# Patient Record
Sex: Male | Born: 1975 | Hispanic: Yes | Marital: Married | Smoking: Never smoker
Health system: Southern US, Community
[De-identification: ages and names within clinical notes are randomized; demographics above are authoritative.]

## PROBLEM LIST (undated history)

## (undated) DIAGNOSIS — G43909 Migraine, unspecified, not intractable, without status migrainosus: Secondary | ICD-10-CM

## (undated) HISTORY — DX: Migraine, unspecified, not intractable, without status migrainosus: G43.909

## (undated) HISTORY — PX: APPENDECTOMY: SHX54

---

## 2007-03-14 ENCOUNTER — Emergency Department: Payer: Self-pay | Admitting: Emergency Medicine

## 2009-09-09 ENCOUNTER — Inpatient Hospital Stay: Payer: Self-pay | Admitting: Surgery

## 2017-02-12 ENCOUNTER — Encounter: Payer: Self-pay | Admitting: *Deleted

## 2017-02-12 DIAGNOSIS — R197 Diarrhea, unspecified: Secondary | ICD-10-CM | POA: Insufficient documentation

## 2017-02-12 DIAGNOSIS — R112 Nausea with vomiting, unspecified: Secondary | ICD-10-CM | POA: Insufficient documentation

## 2017-02-12 DIAGNOSIS — R51 Headache: Secondary | ICD-10-CM | POA: Insufficient documentation

## 2017-02-12 LAB — URINALYSIS, COMPLETE (UACMP) WITH MICROSCOPIC
BILIRUBIN URINE: NEGATIVE
Bacteria, UA: NONE SEEN
Glucose, UA: NEGATIVE mg/dL
Hgb urine dipstick: NEGATIVE
Ketones, ur: 20 mg/dL — AB
Leukocytes, UA: NEGATIVE
Nitrite: NEGATIVE
Protein, ur: 100 mg/dL — AB
SPECIFIC GRAVITY, URINE: 1.025 (ref 1.005–1.030)
SQUAMOUS EPITHELIAL / LPF: NONE SEEN
pH: 8 (ref 5.0–8.0)

## 2017-02-12 LAB — CBC
HCT: 44.9 % (ref 40.0–52.0)
Hemoglobin: 15.8 g/dL (ref 13.0–18.0)
MCH: 30.1 pg (ref 26.0–34.0)
MCHC: 35.2 g/dL (ref 32.0–36.0)
MCV: 85.6 fL (ref 80.0–100.0)
Platelets: 227 10*3/uL (ref 150–440)
RBC: 5.25 MIL/uL (ref 4.40–5.90)
RDW: 13.1 % (ref 11.5–14.5)
WBC: 11.7 10*3/uL — ABNORMAL HIGH (ref 3.8–10.6)

## 2017-02-12 LAB — COMPREHENSIVE METABOLIC PANEL
ALT: 93 U/L — AB (ref 17–63)
AST: 45 U/L — AB (ref 15–41)
Albumin: 4.8 g/dL (ref 3.5–5.0)
Alkaline Phosphatase: 87 U/L (ref 38–126)
Anion gap: 10 (ref 5–15)
BUN: 14 mg/dL (ref 6–20)
CHLORIDE: 102 mmol/L (ref 101–111)
CO2: 29 mmol/L (ref 22–32)
Calcium: 9.7 mg/dL (ref 8.9–10.3)
Creatinine, Ser: 0.81 mg/dL (ref 0.61–1.24)
GFR calc Af Amer: >60 mL/min (ref >60)
GFR calc non Af Amer: >60 mL/min (ref >60)
GLUCOSE: 110 mg/dL — AB (ref 65–99)
POTASSIUM: 4 mmol/L (ref 3.5–5.1)
SODIUM: 141 mmol/L (ref 135–145)
Total Bilirubin: 1.2 mg/dL (ref 0.3–1.2)
Total Protein: 7.9 g/dL (ref 6.5–8.1)

## 2017-02-12 LAB — LIPASE, BLOOD: LIPASE: 19 U/L (ref 11–51)

## 2017-02-12 NOTE — ED Triage Notes (Signed)
Pt has vomiting, abd pain and headache.  Sx began this am.  No diarrhea.  Pt was seen at [facility] today with similar sx.  Pt alert.  Speech clear.

## 2017-02-13 ENCOUNTER — Emergency Department
Admission: EM | Admit: 2017-02-13 | Discharge: 2017-02-13 | Disposition: A | Discharge status: Home / Self Care | Payer: Self-pay | Attending: Emergency Medicine | Admitting: Emergency Medicine

## 2017-02-13 ENCOUNTER — Emergency Department: Payer: Self-pay

## 2017-02-13 ENCOUNTER — Encounter: Payer: Self-pay | Admitting: Emergency Medicine

## 2017-02-13 DIAGNOSIS — R519 Headache, unspecified: Secondary | ICD-10-CM

## 2017-02-13 DIAGNOSIS — R112 Nausea with vomiting, unspecified: Secondary | ICD-10-CM

## 2017-02-13 DIAGNOSIS — R197 Diarrhea, unspecified: Secondary | ICD-10-CM

## 2017-02-13 DIAGNOSIS — R51 Headache: Secondary | ICD-10-CM

## 2017-02-13 MED ORDER — KETOROLAC TROMETHAMINE 30 MG/ML IJ SOLN
30.0000 mg | Freq: Once | INTRAMUSCULAR | Status: AC
  Administered 2017-02-13: 30 mg via INTRAVENOUS
  Filled 2017-02-13: qty 1

## 2017-02-13 MED ORDER — DIPHENHYDRAMINE HCL 50 MG/ML IJ SOLN
25.0000 mg | Freq: Once | INTRAMUSCULAR | Status: AC
  Administered 2017-02-13: 25 mg via INTRAVENOUS
  Filled 2017-02-13: qty 1

## 2017-02-13 MED ORDER — METOCLOPRAMIDE HCL 5 MG/ML IJ SOLN
10.0000 mg | Freq: Once | INTRAMUSCULAR | Status: AC
  Administered 2017-02-13: 10 mg via INTRAVENOUS
  Filled 2017-02-13: qty 2

## 2017-02-13 MED ORDER — METOCLOPRAMIDE HCL 10 MG PO TABS: 10.0000 mg | ORAL_TABLET | Freq: Three times a day (TID) | ORAL | 0 refills | Status: AC | PRN

## 2017-02-13 MED ORDER — BUTALBITAL-APAP-CAFFEINE 50-325-40 MG PO TABS: 1.0000 | ORAL_TABLET | Freq: Four times a day (QID) | ORAL | 0 refills | Status: AC | PRN

## 2017-02-13 MED ORDER — SODIUM CHLORIDE 0.9 % IV BOLUS (SEPSIS)
1000.0000 mL | Freq: Once | INTRAVENOUS | Status: AC
  Administered 2017-02-13: 1000 mL via INTRAVENOUS

## 2017-02-13 NOTE — ED Notes (Signed)
Pt returned from CT °

## 2017-02-13 NOTE — ED Provider Notes (Signed)
[facility] Emergency Department Provider Note   ____________________________________________   First MD Initiated Contact with Patient 02/13/17 0214     (approximate)  I have reviewed the triage vital signs and the nursing notes.   HISTORY  Chief Complaint Emesis; Headache; and Abdominal Pain    HPI Harry Jenkins is a 41 y.o. male who comes into the hospital today with some nausea vomiting and headache for the last 2-3 weeks. He reports that the symptoms have not been getting better. He's been taking some medication given to him by his physician but reports it has not been helping. The patient states that the vomiting started yesterday.He states that he vomited about 10 times. The headache has gotten worse and he also had some dizziness. The patient saw his doctor yesterday and was given some Tylenol and meclizine but he states again that the medications are not helping. He has not had any problems with migraines in the past. He states that the pain is all around his head. He had one episode of diarrhea yesterday and some mild mid abdominal pain. His emesis is clear in appearance as he is unable to keep anything down. He denies any blurred vision, shortness of breath or chest pain. The patient is here today for evaluation.   History reviewed. No pertinent past medical history.  There are no active problems to display for this patient.   Past Surgical History:  Procedure Laterality Date  . APPENDECTOMY      Prior to Admission medications   Medication Sig Start Date End Date Taking? Authorizing Provider  butalbital-acetaminophen-caffeine (FIORICET, ESGIC) 50-325-40 MG tablet Take 1-2 tablets by mouth every 6 (six) hours as needed for headache. 02/13/17 02/13/18  Rebecka Apley, MD  metoCLOPramide (REGLAN) 10 MG tablet Take 1 tablet (10 mg total) by mouth every 8 (eight) hours as needed. 02/13/17   Rebecka Apley, MD    Allergies Patient has no  known allergies.  No family history on file.  Social History Social History  Substance Use Topics  . Smoking status: Never Smoker  . Smokeless tobacco: Never Used  . Alcohol use No    Review of Systems  Constitutional: No fever/chills Eyes: No visual changes. ENT: No sore throat. Cardiovascular: Denies chest pain. Respiratory: Denies shortness of breath. Gastrointestinal:  abdominal pain.   nausea, vomiting.   diarrhea.  No constipation. Genitourinary: Negative for dysuria. Musculoskeletal: Negative for back pain. Skin: Negative for rash. Neurological: Headache, dizziness   ____________________________________________   PHYSICAL EXAM:  VITAL SIGNS: ED Triage Vitals  Enc Vitals Group     BP 02/12/17 2300 (!) 141/90     Pulse Rate 02/12/17 2300 67     Resp 02/12/17 2300 20     Temp 02/12/17 2300 98.3 F (36.8 C)     Temp Source 02/12/17 2300 Oral     SpO2 02/12/17 2300 97 %     Weight 02/12/17 2301 160 lb (72.6 kg)     Height 02/12/17 2301  (1.727 m)     Head Circumference --      Peak Flow --      Pain Score 02/12/17 2300 9     Pain Loc --      Pain Edu? --      Excl. in GC? --     Constitutional: Alert and oriented. Well appearing and in Moderate distress. Eyes: Conjunctivae are normal. PERRL. EOMI. Head: Atraumatic. Nose: No congestion/rhinnorhea. Mouth/Throat: Mucous membranes are moist.  Oropharynx non-erythematous. Cardiovascular: Normal rate, regular rhythm. Grossly normal heart sounds.  Good peripheral circulation. Respiratory: Normal respiratory effort.  No retractions. Lungs CTAB. Gastrointestinal: Soft with some mild diffuse tenderness to palpation. No distention. Positive bowel sounds Musculoskeletal: No lower extremity tenderness nor edema.  No joint effusions. Neurologic:  Normal speech and language. Cranial nerves II through XII are grossly intact with no focal motor or neuro deficits Skin:  Skin is warm, dry and intact.  Psychiatric:  Mood and affect are normal.   ____________________________________________   LABS (all labs ordered are listed, but only abnormal results are displayed)  Labs Reviewed  COMPREHENSIVE METABOLIC PANEL - Abnormal; Notable for the following:       Result Value   Glucose, Bld 110 (*)    AST 45 (*)    ALT 93 (*)    All other components within normal limits  CBC - Abnormal; Notable for the following:    WBC 11.7 (*)    All other components within normal limits  URINALYSIS, COMPLETE (UACMP) WITH MICROSCOPIC - Abnormal; Notable for the following:    Color, Urine YELLOW (*)    APPearance CLEAR (*)    Ketones, ur 20 (*)    Protein, ur 100 (*)    All other components within normal limits  LIPASE, BLOOD   ____________________________________________  EKG  none ____________________________________________  RADIOLOGY  Ct Head Wo Contrast  Result Date: 02/13/2017 CLINICAL DATA:  Vomiting and headache, onset this morning. EXAM: CT HEAD WITHOUT CONTRAST TECHNIQUE: Contiguous axial images were obtained from the base of the skull through the vertex without intravenous contrast. COMPARISON:  None. FINDINGS: Brain: There is no intracranial hemorrhage, mass or evidence of acute infarction. There is no extra-axial fluid collection. Gray matter and white matter appear normal. Cerebral volume is normal for age. Brainstem and posterior fossa are unremarkable. The CSF spaces appear normal. Vascular: No hyperdense vessel or unexpected calcification. Skull: Normal. Negative for fracture or focal lesion. Sinuses/Orbits: No acute finding. Other: None IMPRESSION: Normal brain Electronically Signed   By: Ellery Plunk M.D.   On: 02/13/2017 03:28    ____________________________________________   PROCEDURES  Procedure(s) performed: None  Procedures  Critical Care performed: No  ____________________________________________   INITIAL IMPRESSION / ASSESSMENT AND PLAN / ED COURSE  As part of my  medical decision making, I reviewed the following data within the electronic MEDICAL RECORD NUMBER Notes from prior ED visits   This is a 41 year old male who comes into the hospital today with some nausea vomiting and headaches. The patient has had headaches for multiple weeks.  My differential diagnosis includes an acute headache syndrome, dehydration with viral gastroenteritis, colitis or enteritis.  We did check some blood work which was fairly unremarkable. The patient did have some mildly elevated transaminases but no pancreatitis. He also has a mildly elevated white blood cell count. Given the patient's headache and vomiting I did send him for a CT scan to evaluate for possible mass. I gave the patient some Reglan, Benadryl, Toradol and a liter of normal saline. After the medication the patient reports that his headache is much improved and he feels better. He states that he is ready to go home. I did have the patient drink some ginger ale before he left and he was able to keep it down without any vomiting. He will be discharged home and encouraged to follow-up with his primary care physician as well as neurology.      ____________________________________________   FINAL CLINICAL  IMPRESSION(S) / ED DIAGNOSES  Final diagnoses:  Nausea vomiting and diarrhea  Acute nonintractable headache, unspecified headache type      NEW MEDICATIONS STARTED DURING THIS VISIT:  New Prescriptions   BUTALBITAL-ACETAMINOPHEN-CAFFEINE (FIORICET, ESGIC) 50-325-40 MG TABLET    Take 1-2 tablets by mouth every 6 (six) hours as needed for headache.   METOCLOPRAMIDE (REGLAN) 10 MG TABLET    Take 1 tablet (10 mg total) by mouth every 8 (eight) hours as needed.     Note:  This document was prepared using Dragon voice recognition software and may include unintentional dictation errors.    Rebecka Apley, MD 02/13/17 [phone]

## 2017-08-06 ENCOUNTER — Emergency Department
Admission: EM | Admit: 2017-08-06 | Discharge: 2017-08-06 | Disposition: A | Discharge status: Home / Self Care | Payer: Self-pay | Attending: Emergency Medicine | Admitting: Emergency Medicine

## 2017-08-06 ENCOUNTER — Emergency Department: Payer: Self-pay

## 2017-08-06 ENCOUNTER — Encounter: Payer: Self-pay | Admitting: Emergency Medicine

## 2017-08-06 DIAGNOSIS — R079 Chest pain, unspecified: Secondary | ICD-10-CM | POA: Insufficient documentation

## 2017-08-06 LAB — BASIC METABOLIC PANEL
ANION GAP: 6 (ref 5–15)
BUN: 12 mg/dL (ref 6–20)
CHLORIDE: 106 mmol/L (ref 101–111)
CO2: 28 mmol/L (ref 22–32)
Calcium: 9.4 mg/dL (ref 8.9–10.3)
Creatinine, Ser: 0.7 mg/dL (ref 0.61–1.24)
GFR calc non Af Amer: >60 mL/min (ref >60)
Glucose, Bld: 100 mg/dL — ABNORMAL HIGH (ref 65–99)
Potassium: 3.6 mmol/L (ref 3.5–5.1)
Sodium: 140 mmol/L (ref 135–145)

## 2017-08-06 LAB — CBC
HCT: 44.1 % (ref 40.0–52.0)
HEMOGLOBIN: 15.1 g/dL (ref 13.0–18.0)
MCH: 29.2 pg (ref 26.0–34.0)
MCHC: 34.2 g/dL (ref 32.0–36.0)
MCV: 85.3 fL (ref 80.0–100.0)
Platelets: 310 10*3/uL (ref 150–440)
RBC: 5.17 MIL/uL (ref 4.40–5.90)
RDW: 12.9 % (ref 11.5–14.5)
WBC: 7.9 10*3/uL (ref 3.8–10.6)

## 2017-08-06 LAB — TROPONIN I
Troponin I: <0.03 ng/mL (ref <0.03)
Troponin I: <0.03 ng/mL (ref <0.03)

## 2017-08-06 MED ORDER — ASPIRIN 81 MG PO TABS: 81.0000 mg | ORAL_TABLET | Freq: Every day | ORAL | 0 refills | Status: AC

## 2017-08-06 MED ORDER — FAMOTIDINE 20 MG PO TABS
20.0000 mg | ORAL_TABLET | Freq: Once | ORAL | Status: AC
  Administered 2017-08-06: 20 mg via ORAL
  Filled 2017-08-06: qty 1

## 2017-08-06 MED ORDER — ASPIRIN 81 MG PO CHEW
324.0000 mg | CHEWABLE_TABLET | Freq: Once | ORAL | Status: AC
  Administered 2017-08-06: 324 mg via ORAL
  Filled 2017-08-06: qty 4

## 2017-08-06 MED ORDER — FAMOTIDINE 20 MG PO TABS: 20.0000 mg | ORAL_TABLET | Freq: Two times a day (BID) | ORAL | 1 refills | Status: AC

## 2017-08-06 NOTE — Discharge Instructions (Signed)

## 2017-08-06 NOTE — ED Provider Notes (Signed)
[facility] Emergency Department Provider Note  ____________________________________________  Time seen: Approximately 3:34 PM  I have reviewed the triage vital signs and the nursing notes.   HISTORY  Chief Complaint Chest Pain and Shortness of Breath   HPI Harry Jenkins is a 42 y.o. male no significant past medical history who presents for evaluation of chest pain. Patient reports one episode last week and several episodes over the last 2-3 days. He reports that the episode begins with a pressure in the center of his chest and is associated with difficulty taking a deep breath. He reports that sometimes these symptoms are exacerbated by eating and therefore he is been a little hesitant to eat. He denies abdominal pain, nausea, vomiting, diaphoresis, dizziness associated with these symptoms. He reports that these symptoms can happen both at rest and with exertion. Usually last a few seconds to up to a minute and resolved without intervention. Patient denies any history of smoking. His grandfather had a heart attack at a old age. No other family history of heart attacks, blood clots, recent travel immobilization, leg pain or swelling, hemoptysis, exogenous hormones. Patient last episode was here in the waiting room. He has no pain at this time. He denies any history of reflux.  PMH None  Past Surgical History:  Procedure Laterality Date  . APPENDECTOMY      Prior to Admission medications   Medication Sig Start Date End Date Taking? Authorizing Provider  aspirin 81 MG tablet Take 1 tablet (81 mg total) by mouth daily. 08/06/17   Nita SickleVeronese, [place], MD  butalbital-acetaminophen-caffeine (FIORICET, ESGIC) [phone] MG tablet Take 1-2 tablets by mouth every 6 (six) hours as needed for headache. 02/13/17 02/13/18  Rebecka ApleyWebster, Allison P, MD  famotidine (PEPCID) 20 MG tablet Take 1 tablet (20 mg total) by mouth 2 (two) times daily. 08/06/17 08/06/18  Nita SickleVeronese, [place], MD    metoCLOPramide (REGLAN) 10 MG tablet Take 1 tablet (10 mg total) by mouth every 8 (eight) hours as needed. 02/13/17   Rebecka ApleyWebster, Allison P, MD    Allergies Patient has no known allergies.  FH No heart attacks, DVT or PE  Social History Social History   Tobacco Use  . Smoking status: Never Smoker  . Smokeless tobacco: Never Used  Substance Use Topics  . Alcohol use: No  . Drug use: No    Review of Systems  Constitutional: Negative for fever. Eyes: Negative for visual changes. ENT: Negative for sore throat. Neck: No neck pain  Cardiovascular: + chest pain. Respiratory: + shortness of breath. Gastrointestinal: Negative for abdominal pain, vomiting or diarrhea. Genitourinary: Negative for dysuria. Musculoskeletal: Negative for back pain. Skin: Negative for rash. Neurological: Negative for headaches, weakness or numbness. Psych: No SI or HI  ____________________________________________   PHYSICAL EXAM:  VITAL SIGNS: ED Triage Vitals  Enc Vitals Group     BP 08/06/17 1449 126/78     Pulse Rate 08/06/17 1449 75     Resp 08/06/17 1449 20     Temp 08/06/17 1449 97.9 F (36.6 C)     Temp Source 08/06/17 1449 Oral     SpO2 08/06/17 1449 98 %     Weight 08/06/17 1450 165 lb (74.8 kg)     Height 08/06/17 1450 5\' 8"  (1.727 m)     Head Circumference --      Peak Flow --      Pain Score 08/06/17 1450 8     Pain Loc --  Pain Edu? --      Excl. in GC? --     Constitutional: Alert and oriented. Well appearing and in no apparent distress. HEENT:      Head: Normocephalic and atraumatic.         Eyes: Conjunctivae are normal. Sclera is non-icteric.       Mouth/Throat: Mucous membranes are moist.       Neck: Supple with no signs of meningismus. Cardiovascular: Regular rate and rhythm. No murmurs, gallops, or rubs. 2+ symmetrical distal pulses are present in all extremities. No JVD. Respiratory: Normal respiratory effort. Lungs are clear to auscultation bilaterally. No  wheezes, crackles, or rhonchi.  Gastrointestinal: Soft, non tender, and non distended with positive bowel sounds. No rebound or guarding. Genitourinary: No CVA tenderness. Musculoskeletal: Nontender with normal range of motion in all extremities. No edema, cyanosis, or erythema of extremities. Neurologic: Normal speech and language. Face is symmetric. Moving all extremities. No gross focal neurologic deficits are appreciated. Skin: Skin is warm, dry and intact. No rash noted. Psychiatric: Mood and affect are normal. Speech and behavior are normal.  ____________________________________________   LABS (all labs ordered are listed, but only abnormal results are displayed)  Labs Reviewed  BASIC METABOLIC PANEL - Abnormal; Notable for the following components:      Result Value   Glucose, Bld 100 (*)    All other components within normal limits  CBC  TROPONIN I  TROPONIN I   ____________________________________________  EKG  ED ECG REPORT I, Nita Sickle, the attending physician, personally viewed and interpreted this ECG.  Normal sinus rhythm, rate of 80, normal intervals, normal axis, no ST elevations or depressions. Unchanged from prior other AV block seen 2011 no longer present  18:07 -  sinus bradycardia with first-degree AV block, normal intervals, normal axis, no ST elevations or depressions. Unchanged from EKG from 2011. ____________________________________________  RADIOLOGY  I have personally reviewed the images performed during this visit and I agree with the Radiologist's read.   Interpretation by Radiologist:  Dg Chest 2 View  Result Date: 08/06/2017 CLINICAL DATA:  42 year old male with chest pressure beginning 2 days ago. Shortness of breath. EXAM: CHEST - 2 VIEW COMPARISON:  Chest radiographs 03/14/2007 FINDINGS: Lung volumes and mediastinal contours remain normal. Visualized tracheal air column is within normal limits. No pneumothorax, pulmonary edema,  pleural effusion or confluent pulmonary opacity. No osseous abnormality identified. Negative visible bowel gas pattern. IMPRESSION: Negative.  No acute cardiopulmonary abnormality. Electronically Signed   By: Odessa Fleming M.D.   On: 08/06/2017 15:30   ____________________________________________   PROCEDURES  Procedure(s) performed: None Procedures Critical Care performed:  None ____________________________________________   INITIAL IMPRESSION / ASSESSMENT AND PLAN / ED COURSE   42 y.o. male no significant past medical history who presents for evaluation of chest pain.patient's description of the pain sounds atypical for cardiac disease and sound more like GI in nature. low suspicion for cardiac (HEART score 0) or other serious etiology (including aortic dissection, pneumonia, pneumothorax, or pulmonary embolism) based his history and physical exam in the ED today. EKG normal. Plan for labs including CBC, chemistries and troponin now and in 3 hours, CXR and re-evaluation for dispositon. Will give full dose ASA and pepcid. Will observe patient on cardiac monitor while in the ED.     _________________________ 6:53 PM on 08/06/2017 -----------------------------------------  Patient remains pain free. Troponin and EKG x2 with no acute ischemic findings. Will send patient home on pepcid and daily ASA and referral  to Cardiology for further evaluation. Discussed return precautions with patient.   As part of my medical decision making, I reviewed the following data within the electronic MEDICAL RECORD NUMBER Nursing notes reviewed and incorporated, Labs reviewed , EKG interpreted , Old EKG reviewed, Radiograph reviewed , Notes from prior ED visits and [place] Controlled Substance Database    Pertinent labs & imaging results that were available during my care of the patient were reviewed by me and considered in my medical decision making (see chart for  details).    ____________________________________________   FINAL CLINICAL IMPRESSION(S) / ED DIAGNOSES  Final diagnoses:  Chest pain, unspecified type      NEW MEDICATIONS STARTED DURING THIS VISIT:  ED Discharge Orders        Ordered    famotidine (PEPCID) 20 MG tablet  2 times daily     08/06/17 1823    aspirin 81 MG tablet  Daily     08/06/17 1823       Note:  This document was prepared using Dragon voice recognition software and may include unintentional dictation errors.    Nita Sickle, MD 08/06/17 [phone]

## 2017-08-06 NOTE — ED Triage Notes (Signed)
Pt reports over the last week he has had some intermittent mid chest pain. Pt reports when the pain happens he feels like he can not breathe. Pt describes the pain as pressure. Pt reports been under some increased stress recently as well.

## 2017-08-06 NOTE — ED Notes (Signed)
Patient ambulatory to lobby with steady gait and NAD noted. Verbalized understanding of discharge instructions and follow-up care.  

## 2017-08-06 NOTE — ED Triage Notes (Signed)
First Nurse Note:  C/O chest pressure. States sensation initially began last week, then returned yesterday.  Patietn is AAOx3.  Skin warm and dry. No SOB/ DOE.  NAD

## 2017-08-14 NOTE — Progress Notes (Signed)
Cardiology Office Note  Date:  08/16/2017   ID:  Harry Jenkins, DOB Dec 28, 1975, MRN [id number]  PCP:  [facility]   Chief Complaint  Patient presents with  . other    Follow up [facility] ER; chest pain. Meds reviewed by the pt. verbally. "doing well."     HPI:  Harry Jenkins is a 42 y.o. male  with a history of General anxiety disorder Gastritis With no prior cardiac history No smoking history, no diabetes Who presents by referral from the emergency room for symptoms of chest pain  He reports that he was In the ER 08/06/2017 He developed pressure in the center of his chest , was having difficulty taking a deep breath.  Possibly exacerbated by eating Symptoms at rest and with exertion Last a few seconds to up to a minute and resolved without intervention.   Hospital records reviewed with the patient in detail Discharge from the hospital after negative workup  For the next week he continued to have worsening episodes of SOB, viral syndrome shortness of breath, coughing, mucus, nasal congestion Used a humidifier at home, various Vicks, vapor rubs etc. 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  No other family history of heart attacks, blood clots, recent travel immobilization, leg pain or swelling, hemoptysis, exogenous hormones.  EKG personally reviewed by myself on todays visit Showing normal sinus rhythm with rate 64 bpm no significant ST or T wave changes   Lab work reviewed showing AST 42, ALT 84 hemoglobin A1c 5.3 C-reactive protein 2.1 normal CBC, BMP   PMH:   has a past medical history of Migraines.  PSH:    Past Surgical History:  Procedure Laterality Date  . APPENDECTOMY      Current Outpatient Medications  Medication Sig Dispense Refill  . aspirin 81 MG tablet Take 1 tablet (81 mg  total) by mouth daily. 30 tablet 0  . butalbital-acetaminophen-caffeine (FIORICET, ESGIC) 50-325-40 MG tablet Take 1-2 tablets by mouth every 6 (six) hours as needed for headache. 20 tablet 0  . famotidine (PEPCID) 20 MG tablet Take 1 tablet (20 mg total) by mouth 2 (two) times daily. 60 tablet 1  . metoCLOPramide (REGLAN) 10 MG tablet Take 1 tablet (10 mg total) by mouth every 8 (eight) hours as needed. 20 tablet 0   No current facility-administered medications for this visit.      Allergies:   Patient has no known allergies.   Social History:  The patient  reports that he has never smoked. He has never used smokeless tobacco. He reports that he does not drink alcohol or use drugs.   Family History:   family history includes Diabetes in his mother; Hypertension in his mother.    Review of Systems: Review of Systems  Constitutional: Negative.   Respiratory: Negative.   Cardiovascular: Positive for chest pain.  Gastrointestinal: Negative.   Musculoskeletal: Negative.   Neurological: Negative.   Psychiatric/Behavioral: Negative.   All other systems reviewed and are negative.    PHYSICAL EXAM: VS:  BP 118/72 (BP Location: Right Arm, Patient Position: Sitting, Cuff Size: Normal)   Pulse 64   Ht 5\' 8"  (1.727 m)   Wt 168 lb 12 oz (76.5 kg)   BMI 25.66 kg/m  , BMI Body mass index is 25.66  kg/m. GEN: Well nourished, well developed, in no acute distress  HEENT: normal  Neck: no JVD, carotid bruits, or masses Cardiac: RRR; no murmurs, rubs, or gallops,no edema  Respiratory:  clear to auscultation bilaterally, normal work of breathing GI: soft, nontender, nondistended, + BS MS: no deformity or atrophy  Skin: warm and dry, no rash Neuro:  Strength and sensation are intact Psych: euthymic mood, full affect    Recent Labs: 02/12/2017: ALT 93 08/06/2017: BUN 12; Creatinine, Ser 0.70; Hemoglobin 15.1; Platelets 310; Potassium 3.6; Sodium 140    Lipid Panel No results found for:  CHOL, HDL, LDLCALC, TRIG    Wt Readings from Last 3 Encounters:  08/15/17 168 lb 12 oz (76.5 kg)  08/06/17 165 lb (74.8 kg)  02/12/17 160 lb (72.6 kg)       ASSESSMENT AND PLAN:  Chest pain, unspecified type - Plan: EKG 12-Lead Atypical chest pain in the setting of upper respiratory tract infection Other symptoms at that time included sore throat, runny nose, mucus in his chest, coughing Symptoms have resolved, now feels back to his baseline 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  No further workup at this time  Elevated LFTs Does not drink alcohol Not markedly overweight though unable to exclude NASH  Disposition:   F/U as needed   Total encounter time more than 45 minutes  Greater than 50% was spent in counseling and coordination of care with the patient    Orders Placed This Encounter  Procedures  . EKG 12-Lead     Signed, Dossie Arbourim Keygan Dumond, M.D., Ph.D. 08/16/2017  [facility], [place] [phone]

## 2017-08-15 ENCOUNTER — Encounter: Payer: Self-pay | Admitting: Cardiovascular Disease

## 2017-08-15 ENCOUNTER — Ambulatory Visit: Payer: Self-pay | Admitting: Cardiovascular Disease

## 2017-08-15 VITALS — BP 118/72 | HR 64 | Ht 68.0 in | Wt 168.8 lb

## 2017-08-15 DIAGNOSIS — R7989 Other specified abnormal findings of blood chemistry: Secondary | ICD-10-CM

## 2017-08-15 DIAGNOSIS — R079 Chest pain, unspecified: Secondary | ICD-10-CM

## 2017-08-15 DIAGNOSIS — F411 Generalized anxiety disorder: Secondary | ICD-10-CM

## 2017-08-15 DIAGNOSIS — Z0000 Encounter for general adult medical examination without abnormal findings: Secondary | ICD-10-CM

## 2017-08-15 DIAGNOSIS — J069 Acute upper respiratory infection, unspecified: Secondary | ICD-10-CM

## 2017-08-15 DIAGNOSIS — R945 Abnormal results of liver function studies: Secondary | ICD-10-CM

## 2017-08-15 NOTE — Patient Instructions (Signed)

## 2017-08-16 DIAGNOSIS — F411 Generalized anxiety disorder: Secondary | ICD-10-CM | POA: Insufficient documentation

## 2017-08-16 DIAGNOSIS — J069 Acute upper respiratory infection, unspecified: Secondary | ICD-10-CM | POA: Insufficient documentation

## 2017-08-16 DIAGNOSIS — Z0000 Encounter for general adult medical examination without abnormal findings: Secondary | ICD-10-CM | POA: Insufficient documentation

## 2017-08-16 DIAGNOSIS — R945 Abnormal results of liver function studies: Secondary | ICD-10-CM

## 2017-08-16 DIAGNOSIS — R7989 Other specified abnormal findings of blood chemistry: Secondary | ICD-10-CM | POA: Insufficient documentation

## 2017-08-16 DIAGNOSIS — R079 Chest pain, unspecified: Secondary | ICD-10-CM | POA: Insufficient documentation

## 2018-02-05 ENCOUNTER — Other Ambulatory Visit: Payer: Self-pay | Admitting: Unknown Physician Specialty

## 2018-02-05 DIAGNOSIS — R42 Dizziness and giddiness: Secondary | ICD-10-CM

## 2018-02-05 DIAGNOSIS — R51 Headache: Secondary | ICD-10-CM

## 2018-02-05 DIAGNOSIS — R519 Headache, unspecified: Secondary | ICD-10-CM

## 2018-02-20 ENCOUNTER — Ambulatory Visit
Admission: RE | Admit: 2018-02-20 | Discharge: 2018-02-20 | Disposition: A | Discharge status: Home / Self Care | Payer: Self-pay | Source: Ambulatory Visit | Attending: Unknown Physician Specialty | Admitting: Unknown Physician Specialty

## 2018-02-20 DIAGNOSIS — R51 Headache: Secondary | ICD-10-CM | POA: Insufficient documentation

## 2018-02-20 DIAGNOSIS — R519 Headache, unspecified: Secondary | ICD-10-CM

## 2018-02-20 DIAGNOSIS — R42 Dizziness and giddiness: Secondary | ICD-10-CM | POA: Insufficient documentation

## 2018-02-20 MED ORDER — GADOBUTROL 1 MMOL/ML IV SOLN
7.5000 mL | Freq: Once | INTRAVENOUS | Status: AC | PRN
  Administered 2018-02-20: 7.5 mL via INTRAVENOUS

## 2019-07-23 ENCOUNTER — Ambulatory Visit: Payer: Self-pay

## 2019-07-23 ENCOUNTER — Ambulatory Visit: Payer: Self-pay | Attending: Internal Medicine

## 2019-10-20 IMAGING — CR DG CHEST 2V
1 series · 2 of 2 positions shown · non-contrast
Comparison: Chest radiographs 03/14/2007

CLINICAL DATA: 42-year-old male with chest pressure beginning 2
days ago. Shortness of breath.

EXAM:
CHEST - 2 VIEW

[Series 1: dg chest 2 view · 0.14mm/px · 2 of 2 slices shown]
[im 1/2]
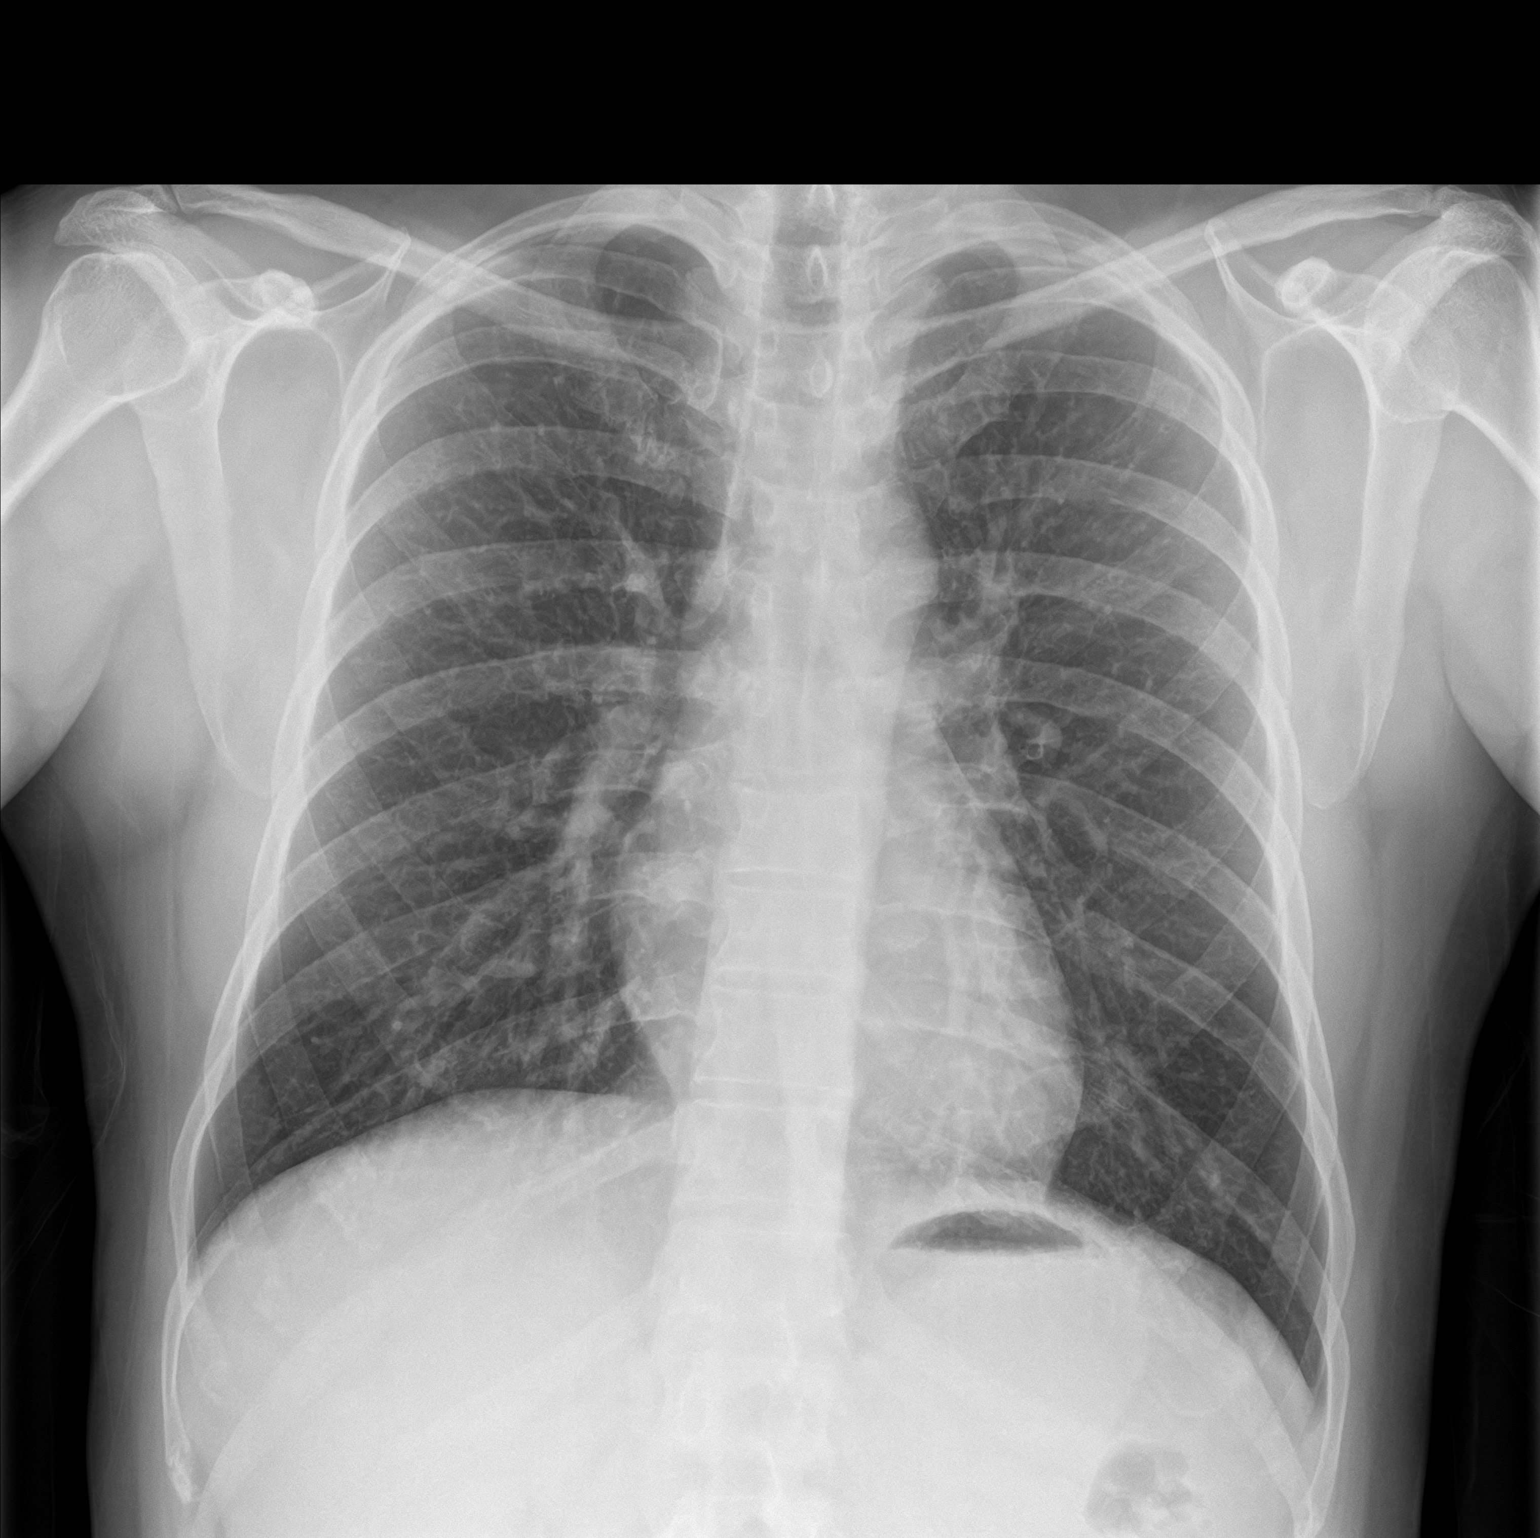
[im 2/2]
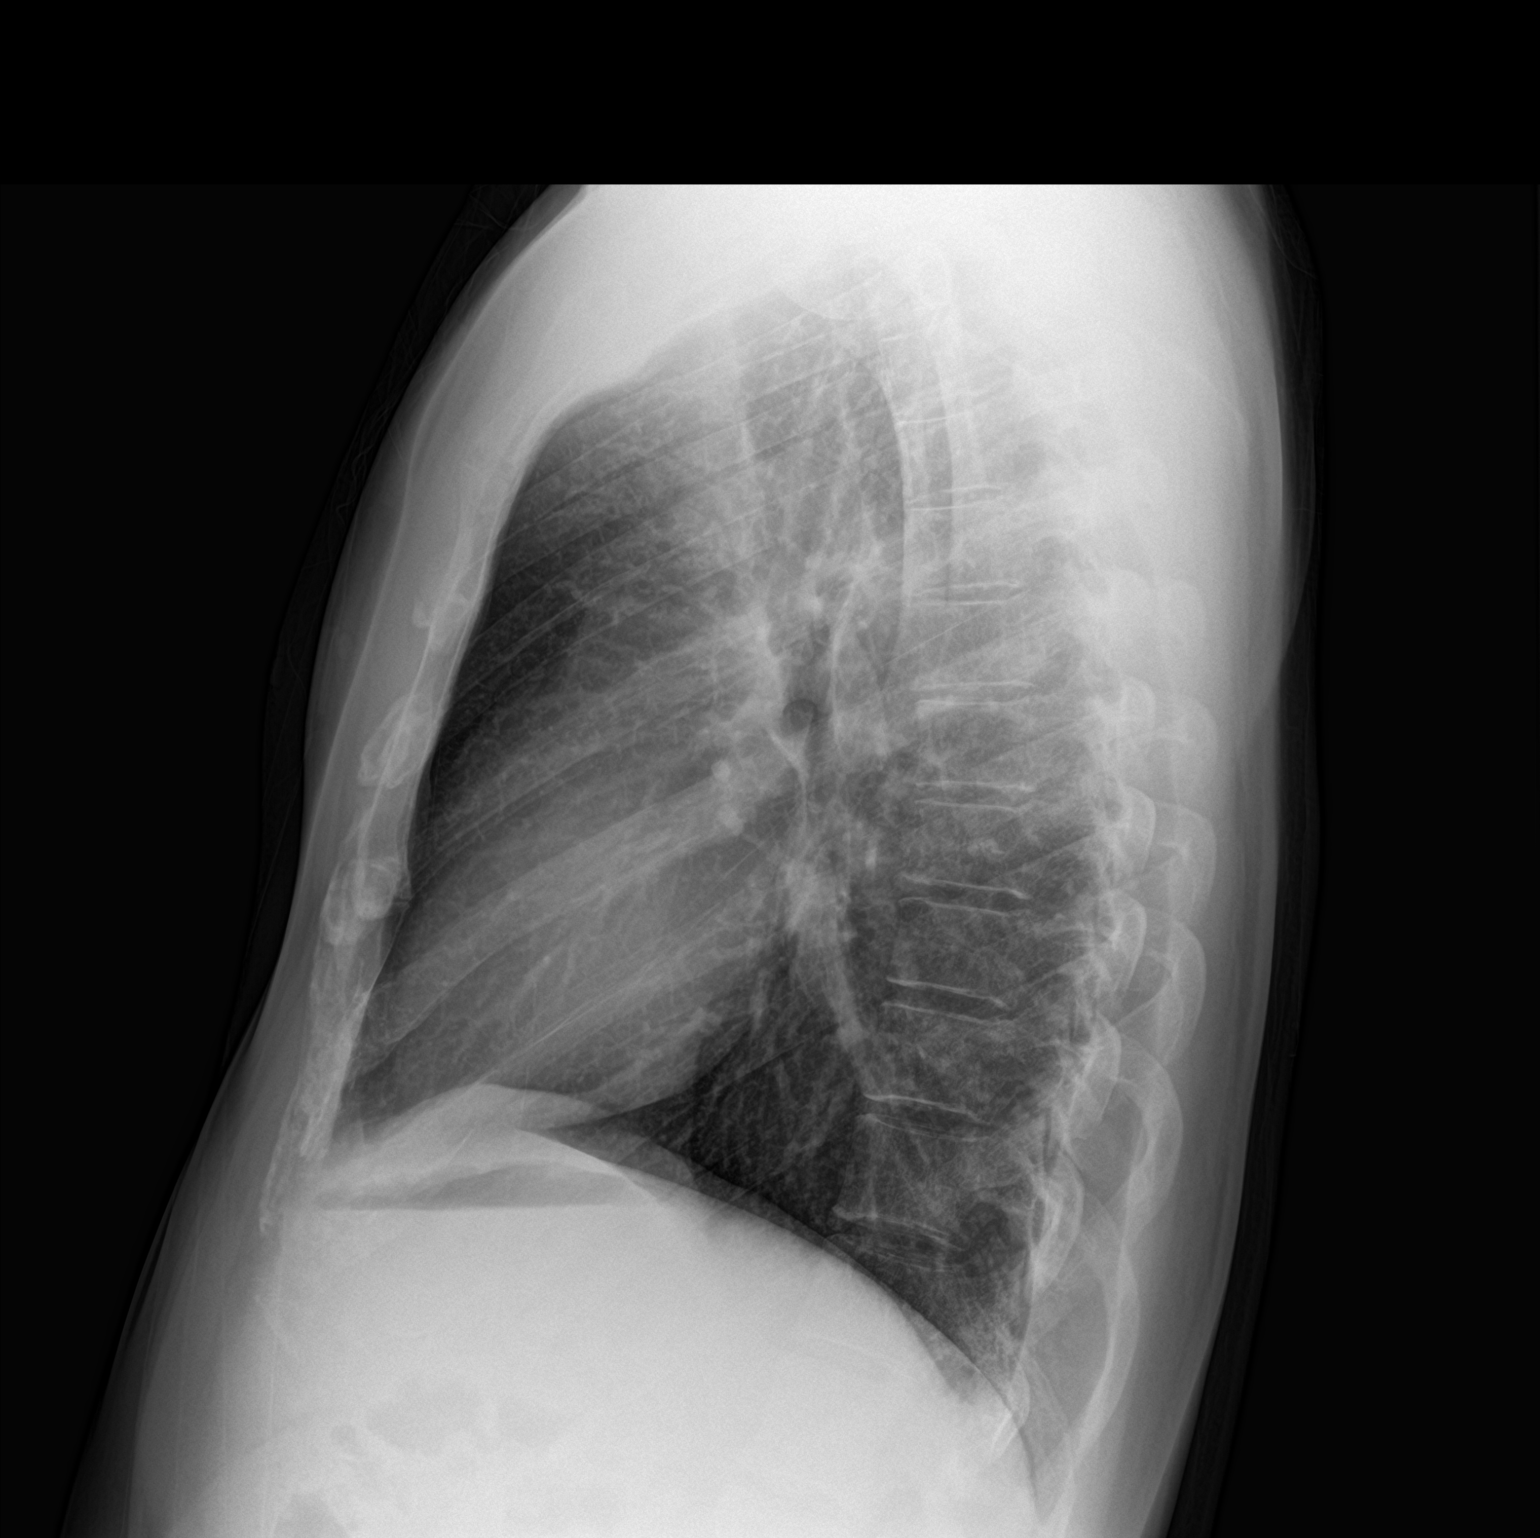

[2 of 2 positions shown; findings below may reference images not displayed]

FINDINGS: Lung volumes and mediastinal contours remain normal. Visualized
tracheal air column is within normal limits. No pneumothorax,
pulmonary edema, pleural effusion or confluent pulmonary opacity. No
osseous abnormality identified. Negative visible bowel gas pattern.
IMPRESSION: Negative.  No acute cardiopulmonary abnormality.

## 2021-06-28 ENCOUNTER — Other Ambulatory Visit: Payer: Self-pay

## 2021-06-28 ENCOUNTER — Encounter: Payer: Self-pay | Admitting: *Deleted

## 2021-06-28 ENCOUNTER — Emergency Department
Admission: EM | Admit: 2021-06-28 | Discharge: 2021-06-29 | Disposition: A | Discharge status: Home / Self Care | Payer: Self-pay | Attending: Emergency Medicine | Admitting: Emergency Medicine

## 2021-06-28 DIAGNOSIS — K529 Noninfective gastroenteritis and colitis, unspecified: Secondary | ICD-10-CM | POA: Insufficient documentation

## 2021-06-28 DIAGNOSIS — R112 Nausea with vomiting, unspecified: Secondary | ICD-10-CM

## 2021-06-28 DIAGNOSIS — Z20822 Contact with and (suspected) exposure to covid-19: Secondary | ICD-10-CM | POA: Insufficient documentation

## 2021-06-28 DIAGNOSIS — R109 Unspecified abdominal pain: Secondary | ICD-10-CM

## 2021-06-28 NOTE — ED Triage Notes (Signed)
Pt reports abd pain with vomiting and diarrhea since yesterday.  Similar episode 3 weeks ago.   Pt taking nausea meds without relief.  Pt alert.

## 2021-06-29 LAB — URINALYSIS, ROUTINE W REFLEX MICROSCOPIC
Bacteria, UA: NONE SEEN
Bilirubin Urine: NEGATIVE
Glucose, UA: NEGATIVE mg/dL
Ketones, ur: 20 mg/dL — AB
Leukocytes,Ua: NEGATIVE
Nitrite: NEGATIVE
Protein, ur: 30 mg/dL — AB
Specific Gravity, Urine: 1.03 (ref 1.005–1.030)
pH: 5 (ref 5.0–8.0)

## 2021-06-29 LAB — COMPREHENSIVE METABOLIC PANEL
ALT: 35 U/L (ref 0–44)
AST: 27 U/L (ref 15–41)
Albumin: 5.1 g/dL — ABNORMAL HIGH (ref 3.5–5.0)
Alkaline Phosphatase: 80 U/L (ref 38–126)
Anion gap: 10 (ref 5–15)
BUN: 16 mg/dL (ref 6–20)
CO2: 27 mmol/L (ref 22–32)
Calcium: 9.7 mg/dL (ref 8.9–10.3)
Chloride: 103 mmol/L (ref 98–111)
Creatinine, Ser: 0.86 mg/dL (ref 0.61–1.24)
GFR, Estimated: >60 mL/min (ref >60)
Glucose, Bld: 113 mg/dL — ABNORMAL HIGH (ref 70–99)
Potassium: 3.8 mmol/L (ref 3.5–5.1)
Sodium: 140 mmol/L (ref 135–145)
Total Bilirubin: 1.2 mg/dL (ref 0.3–1.2)
Total Protein: 8.2 g/dL — ABNORMAL HIGH (ref 6.5–8.1)

## 2021-06-29 LAB — CBC
HCT: 47.1 % (ref 39.0–52.0)
Hemoglobin: 15.9 g/dL (ref 13.0–17.0)
MCH: 28.9 pg (ref 26.0–34.0)
MCHC: 33.8 g/dL (ref 30.0–36.0)
MCV: 85.5 fL (ref 80.0–100.0)
Platelets: 269 10*3/uL (ref 150–400)
RBC: 5.51 MIL/uL (ref 4.22–5.81)
RDW: 12.4 % (ref 11.5–15.5)
WBC: 7.2 10*3/uL (ref 4.0–10.5)
nRBC: 0 % (ref 0.0–0.2)

## 2021-06-29 LAB — LIPASE, BLOOD: Lipase: 34 U/L (ref 11–51)

## 2021-06-29 LAB — RESP PANEL BY RT-PCR (FLU A&B, COVID) ARPGX2
Influenza A by PCR: NEGATIVE
Influenza B by PCR: NEGATIVE
SARS Coronavirus 2 by RT PCR: NEGATIVE

## 2021-06-29 MED ORDER — ONDANSETRON HCL 4 MG/2ML IJ SOLN
4.0000 mg | Freq: Once | INTRAMUSCULAR | Status: AC
Start: 2021-06-29 — End: 2021-06-29
  Administered 2021-06-29: 4 mg via INTRAVENOUS
  Filled 2021-06-29: qty 2

## 2021-06-29 MED ORDER — KETOROLAC TROMETHAMINE 15 MG/ML IJ SOLN
15.0000 mg | Freq: Once | INTRAMUSCULAR | Status: AC
  Administered 2021-06-29: 15 mg via INTRAVENOUS
  Filled 2021-06-29: qty 1

## 2021-06-29 MED ORDER — SODIUM CHLORIDE 0.9 % IV BOLUS
1000.0000 mL | Freq: Once | INTRAVENOUS | Status: AC
  Administered 2021-06-29: 1000 mL via INTRAVENOUS

## 2021-06-29 MED ORDER — ONDANSETRON 4 MG PO TBDP: 4.0000 mg | ORAL_TABLET | Freq: Three times a day (TID) | ORAL | 0 refills | Status: DC | PRN

## 2021-06-29 NOTE — Discharge Instructions (Signed)

## 2021-06-29 NOTE — ED Provider Notes (Signed)
[facility] Provider Note    Event Date/Time   First MD Initiated Contact with Patient 06/28/21 2359     (approximate)   History   Abdominal Pain   HPI  Harry Jenkins is a 46 y.o. male with a history of migraines who presents for evaluation of vomiting and diarrhea.  Patient reports that his symptoms started yesterday.  He is complaining of diffuse cramping abdominal pain, several episodes of nonbloody nonbilious emesis over the last 2 days and diarrhea that he had on the day 1 but that has resolved.  No fever or chills, no cough, no congestion, no chest pain or shortness of breath.  No prior abdominal surgeries.  No dysuria or hematuria     Past Medical History:  Diagnosis Date   Migraines     Past Surgical History:  Procedure Laterality Date   APPENDECTOMY       Physical Exam   Triage Vital Signs: ED Triage Vitals  Enc Vitals Group     BP 06/28/21 2337 140/86     Pulse Rate 06/28/21 2337 (!) 115     Resp 06/28/21 2337 20     Temp 06/28/21 2337 99.4 F (37.4 C)     Temp Source 06/28/21 2337 Oral     SpO2 06/28/21 2337 99 %     Weight 06/28/21 2334 168 lb (76.2 kg)     Height 06/28/21 2334 5\' 8"  (1.727 m)     Head Circumference --      Peak Flow --      Pain Score 06/28/21 2333 5     Pain Loc --      Pain Edu? --      Excl. in [place]? --     Most recent vital signs: Vitals:   06/28/21 2337 06/29/21 0300  BP: 140/86 122/86  Pulse: (!) 115 (!) 56  Resp: 20 16  Temp: 99.4 F (37.4 C)   SpO2: 99% 99%     Constitutional: Alert and oriented. Well appearing and in no apparent distress. HEENT:      Head: Normocephalic and atraumatic.         Eyes: Conjunctivae are normal. Sclera is non-icteric.       Mouth/Throat: Mucous membranes are moist.       Neck: Supple with no signs of meningismus. Cardiovascular: Regular rate and rhythm. No murmurs, gallops, or rubs. 2+ symmetrical distal pulses are present in all extremities.  Respiratory:  Normal respiratory effort. Lungs are clear to auscultation bilaterally.  Gastrointestinal: Soft, non tender, and non distended with positive bowel sounds. No rebound or guarding. Genitourinary: No CVA tenderness. Musculoskeletal:  No edema, cyanosis, or erythema of extremities. Neurologic: Normal speech and language. Face is symmetric. Moving all extremities. No gross focal neurologic deficits are appreciated. Skin: Skin is warm, dry and intact. No rash noted. Psychiatric: Mood and affect are normal. Speech and behavior are normal.  ED Results / Procedures / Treatments   Labs (all labs ordered are listed, but only abnormal results are displayed) Labs Reviewed  COMPREHENSIVE METABOLIC PANEL - Abnormal; Notable for the following components:      Result Value   Glucose, Bld 113 (*)    Total Protein 8.2 (*)    Albumin 5.1 (*)    All other components within normal limits  URINALYSIS, ROUTINE W REFLEX MICROSCOPIC - Abnormal; Notable for the following components:   Color, Urine YELLOW (*)    APPearance HAZY (*)    Hgb urine  dipstick SMALL (*)    Ketones, ur 20 (*)    Protein, ur 30 (*)    All other components within normal limits  RESP PANEL BY RT-PCR (FLU A&B, COVID) ARPGX2  LIPASE, BLOOD  CBC     EKG  none   RADIOLOGY none   PROCEDURES:  Critical Care performed: No  Procedures    IMPRESSION / MDM / ASSESSMENT AND PLAN / ED COURSE  I reviewed the triage vital signs and the nursing notes.  46 y.o. male with a history of migraines who presents for evaluation of vomiting and diarrhea.  Patient with 2 days of diffuse crampy abdominal pain, vomiting and diarrhea.  Patient is well-appearing and in no distress, low-grade temp of 99.82F and a pulse of 115.  Normotensive.  Abdomen is soft and nontender throughout  Ddx: Viral gastroenteritis versus food poisoning versus colitis    Plan: CBC, CMP, lipase, COVID and flu swab, urinalysis.  Will give IV fluids, IV Toradol, IV  Zofran   MEDICATIONS GIVEN IN ED: Medications  sodium chloride 0.9 % bolus 1,000 mL (0 mLs Intravenous Stopped 06/29/21 0222)  ondansetron (ZOFRAN) injection 4 mg (4 mg Intravenous Given 06/29/21 0040)  ketorolac (TORADOL) 15 MG/ML injection 15 mg (15 mg Intravenous Given 06/29/21 0042)     ED COURSE: Labs showing no leukocytosis, no anemia, no AKI, no significant electrolyte derangements, normal lipase and LFTs.  Negative COVID and flu.  UA with no signs of infection.  Patient remains with no abdominal tenderness.  After receiving the above therapy patient feels markedly improved, no longer vomiting and tolerating p.o.  Presentation is most likely consistent with gastroenteritis.  Admission was considered but felt unnecessary with normal labs and symptoms that have resolved.  We discussed rehydration, Zofran use, and close follow-up with primary care doctor.  Recommended return to the hospital for new or worsening abdominal pain, fever, recurrence of his vomiting.   Consults: None   EMR reviewed Including last visit with his cardiologist from 2019 for chest pain    FINAL CLINICAL IMPRESSION(S) / ED DIAGNOSES   Final diagnoses:  Nausea vomiting and diarrhea  Abdominal cramping  Gastroenteritis     Rx / DC Orders   ED Discharge Orders          Ordered    ondansetron (ZOFRAN-ODT) 4 MG disintegrating tablet  Every 8 hours PRN        06/29/21 0235             Note:  This document was prepared using Dragon voice recognition software and may include unintentional dictation errors.   Please note:  Patient was evaluated in Emergency Department today for the symptoms described in the history of present illness. Patient was evaluated in the context of the global COVID-19 pandemic, which necessitated consideration that the patient might be at risk for infection with the SARS-CoV-2 virus that causes COVID-19. Institutional protocols and algorithms that pertain to the evaluation of  patients at risk for COVID-19 are in a state of rapid change based on information released by regulatory bodies including the CDC and federal and state organizations. These policies and algorithms were followed during the patient's care in the ED.  Some ED evaluations and interventions may be delayed as a result of limited staffing during the pandemic.       Alfred Levins, [place], MD 06/29/21 [phone]

## 2022-02-16 ENCOUNTER — Emergency Department: Payer: Self-pay

## 2022-02-16 ENCOUNTER — Other Ambulatory Visit: Payer: Self-pay

## 2022-02-16 ENCOUNTER — Emergency Department
Admission: EM | Admit: 2022-02-16 | Discharge: 2022-02-16 | Disposition: A | Discharge status: Home / Self Care | Payer: Self-pay | Attending: Emergency Medicine | Admitting: Emergency Medicine

## 2022-02-16 DIAGNOSIS — R1010 Upper abdominal pain, unspecified: Secondary | ICD-10-CM

## 2022-02-16 DIAGNOSIS — R1013 Epigastric pain: Secondary | ICD-10-CM | POA: Insufficient documentation

## 2022-02-16 DIAGNOSIS — R1110 Vomiting, unspecified: Secondary | ICD-10-CM | POA: Insufficient documentation

## 2022-02-16 DIAGNOSIS — Z20822 Contact with and (suspected) exposure to covid-19: Secondary | ICD-10-CM | POA: Insufficient documentation

## 2022-02-16 LAB — URINALYSIS, ROUTINE W REFLEX MICROSCOPIC
Bacteria, UA: NONE SEEN
Bilirubin Urine: NEGATIVE
Glucose, UA: NEGATIVE mg/dL
Ketones, ur: 20 mg/dL — AB
Leukocytes,Ua: NEGATIVE
Nitrite: NEGATIVE
Protein, ur: 30 mg/dL — AB
Specific Gravity, Urine: 1.032 — ABNORMAL HIGH (ref 1.005–1.030)
Squamous Epithelial / HPF: NONE SEEN (ref 0–5)
pH: 5 (ref 5.0–8.0)

## 2022-02-16 LAB — CBC WITH DIFFERENTIAL/PLATELET
Abs Immature Granulocytes: 0.02 10*3/uL (ref 0.00–0.07)
Basophils Absolute: 0 10*3/uL (ref 0.0–0.1)
Basophils Relative: 1 %
Eosinophils Absolute: 0 10*3/uL (ref 0.0–0.5)
Eosinophils Relative: 1 %
HCT: 47.3 % (ref 39.0–52.0)
Hemoglobin: 16.3 g/dL (ref 13.0–17.0)
Immature Granulocytes: 0 %
Lymphocytes Relative: 31 %
Lymphs Abs: 2.4 10*3/uL (ref 0.7–4.0)
MCH: 29.2 pg (ref 26.0–34.0)
MCHC: 34.5 g/dL (ref 30.0–36.0)
MCV: 84.6 fL (ref 80.0–100.0)
Monocytes Absolute: 0.5 10*3/uL (ref 0.1–1.0)
Monocytes Relative: 7 %
Neutro Abs: 4.7 10*3/uL (ref 1.7–7.7)
Neutrophils Relative %: 60 %
Platelets: 288 10*3/uL (ref 150–400)
RBC: 5.59 MIL/uL (ref 4.22–5.81)
RDW: 12 % (ref 11.5–15.5)
WBC: 7.7 10*3/uL (ref 4.0–10.5)
nRBC: 0 % (ref 0.0–0.2)

## 2022-02-16 LAB — COMPREHENSIVE METABOLIC PANEL
ALT: 45 U/L — ABNORMAL HIGH (ref 0–44)
AST: 31 U/L (ref 15–41)
Albumin: 5.1 g/dL — ABNORMAL HIGH (ref 3.5–5.0)
Alkaline Phosphatase: 94 U/L (ref 38–126)
Anion gap: 9 (ref 5–15)
BUN: 17 mg/dL (ref 6–20)
CO2: 28 mmol/L (ref 22–32)
Calcium: 9.7 mg/dL (ref 8.9–10.3)
Chloride: 103 mmol/L (ref 98–111)
Creatinine, Ser: 0.98 mg/dL (ref 0.61–1.24)
GFR, Estimated: >60 mL/min (ref >60)
Glucose, Bld: 108 mg/dL — ABNORMAL HIGH (ref 70–99)
Potassium: 3.9 mmol/L (ref 3.5–5.1)
Sodium: 140 mmol/L (ref 135–145)
Total Bilirubin: 1.1 mg/dL (ref 0.3–1.2)
Total Protein: 8.3 g/dL — ABNORMAL HIGH (ref 6.5–8.1)

## 2022-02-16 LAB — RESP PANEL BY RT-PCR (FLU A&B, COVID) ARPGX2
Influenza A by PCR: NEGATIVE
Influenza B by PCR: NEGATIVE
SARS Coronavirus 2 by RT PCR: NEGATIVE

## 2022-02-16 LAB — LIPASE, BLOOD: Lipase: 29 U/L (ref 11–51)

## 2022-02-16 MED ORDER — ONDANSETRON HCL 4 MG/2ML IJ SOLN
4.0000 mg | Freq: Once | INTRAMUSCULAR | Status: AC
  Administered 2022-02-16: 4 mg via INTRAVENOUS
  Filled 2022-02-16: qty 2

## 2022-02-16 MED ORDER — IOHEXOL 300 MG/ML  SOLN
100.0000 mL | Freq: Once | INTRAMUSCULAR | Status: AC | PRN
  Administered 2022-02-16: 100 mL via INTRAVENOUS

## 2022-02-16 MED ORDER — SODIUM CHLORIDE 0.9 % IV BOLUS
1000.0000 mL | Freq: Once | INTRAVENOUS | Status: AC
  Administered 2022-02-16: 1000 mL via INTRAVENOUS

## 2022-02-16 MED ORDER — ONDANSETRON 4 MG PO TBDP: 4.0000 mg | ORAL_TABLET | Freq: Three times a day (TID) | ORAL | 0 refills | Status: AC | PRN

## 2022-02-16 MED ORDER — FAMOTIDINE IN NACL 20-0.9 MG/50ML-% IV SOLN
20.0000 mg | Freq: Once | INTRAVENOUS | Status: AC
  Administered 2022-02-16: 20 mg via INTRAVENOUS
  Filled 2022-02-16: qty 50

## 2022-02-16 NOTE — ED Provider Triage Note (Signed)
  Emergency Medicine Provider Triage Evaluation Note  Harry Jenkins , a 46 y.o.male,  was evaluated in triage.  Pt complains of abdominal pain and vomiting.  Patient states that has been going on for the past 5 days.  Reports generalized abdominal pain, particular in the upper region, with multiple episodes of vomiting.  He states that he has had H. pylori in the past, but states that this feels different.     Review of Systems  Positive: Vomiting, abdominal pain Negative: Denies fever, chest pain, vomiting  Physical Exam  There were no vitals filed for this visit. Gen:   Awake, no distress   Resp:  Normal effort  MSK:   Moves extremities without difficulty  Other:    Medical Decision Making  Given the patient's initial medical screening exam, the following diagnostic evaluation has been ordered. The patient will be placed in the appropriate treatment space, once one is available, to complete the evaluation and treatment. I have discussed the plan of care with the patient and I have advised the patient that an ED physician or mid-level practitioner will reevaluate their condition after the test results have been received, as the results may give them additional insight into the type of treatment they may need.    Diagnostics: Labs, UA, abdominal CT  Treatments: none immediately   Teodoro Spray, [place] 02/16/22 1657

## 2022-02-16 NOTE — ED Provider Notes (Signed)
[facility] Provider Note  Patient Contact: 9:39 PM (approximate)   History   Abdominal Pain   HPI  Harry Jenkins is a 46 y.o. male with a history of migraines, prior appendectomy and H. pylori, presents to the emergency department with epigastric abdominal pain and vomiting that started on Thursday.  Patient denies fever and chills.  No diarrhea.  No sick contacts in the home with similar symptoms.  Patient reports that he experienced similar pain several months ago when he was diagnosed with H. pylori.  He denies chest pain, chest tightness or shortness of breath.  No flank pain.      Physical Exam   Triage Vital Signs: ED Triage Vitals  Enc Vitals Group     BP 02/16/22 1709 137/89     Pulse Rate 02/16/22 1709 69     Resp 02/16/22 1709 16     Temp 02/16/22 1709 98.2 F (36.8 C)     Temp Source 02/16/22 1709 Oral     SpO2 02/16/22 1709 96 %     Weight 02/16/22 1710 165 lb (74.8 kg)     Height 02/16/22 1710 5\' 8"  (1.727 m)     Head Circumference --      Peak Flow --      Pain Score 02/16/22 1717 8     Pain Loc --      Pain Edu? --      Excl. in [place]? --     Most recent vital signs: Vitals:   02/16/22 2153 02/16/22 2338  BP:  130/78  Pulse: 63 60  Resp: 17 16  Temp:    SpO2: 97% 98%     General: Alert and in no acute distress. Eyes:  PERRL. EOMI. Head: No acute traumatic findings ENT:      Nose: No congestion/rhinnorhea.      Mouth/Throat: Mucous membranes are moist. Neck: No stridor. No cervical spine tenderness to palpation. Cardiovascular:  Good peripheral perfusion Respiratory: Normal respiratory effort without tachypnea or retractions. Lungs CTAB. Good air entry to the bases with no decreased or absent breath sounds. Gastrointestinal: Bowel sounds 4 quadrants. Soft and nontender to palpation. No guarding or rigidity. No palpable masses. No distention. No CVA tenderness. Musculoskeletal: Full range of motion to all extremities.   Neurologic:  No gross focal neurologic deficits are appreciated.  Skin:   No rash noted Other:   ED Results / Procedures / Treatments   Labs (all labs ordered are listed, but only abnormal results are displayed) Labs Reviewed  COMPREHENSIVE METABOLIC PANEL - Abnormal; Notable for the following components:      Result Value   Glucose, Bld 108 (*)    Total Protein 8.3 (*)    Albumin 5.1 (*)    ALT 45 (*)    All other components within normal limits  URINALYSIS, ROUTINE W REFLEX MICROSCOPIC - Abnormal; Notable for the following components:   Color, Urine AMBER (*)    APPearance HAZY (*)    Specific Gravity, Urine 1.032 (*)    Hgb urine dipstick SMALL (*)    Ketones, ur 20 (*)    Protein, ur 30 (*)    All other components within normal limits  RESP PANEL BY RT-PCR (FLU A&B, COVID) ARPGX2  CBC WITH DIFFERENTIAL/PLATELET  LIPASE, BLOOD        RADIOLOGY  I personally viewed and evaluated these images as part of my medical decision making, as well as reviewing the written report by the  radiologist.  ED Provider Interpretation:  CT Abdomen and Pelvis: No acute abnormality   PROCEDURES:  Critical Care performed: No  Procedures   MEDICATIONS ORDERED IN ED: Medications  iohexol (OMNIPAQUE) 300 MG/ML solution 100 mL (100 mLs Intravenous Contrast Given 02/16/22 2011)  sodium chloride 0.9 % bolus 1,000 mL (0 mLs Intravenous Stopped 02/16/22 2317)  ondansetron (ZOFRAN) injection 4 mg (4 mg Intravenous Given 02/16/22 2144)  famotidine (PEPCID) IVPB 20 mg premix (0 mg Intravenous Stopped 02/16/22 2217)     IMPRESSION / MDM / ASSESSMENT AND PLAN / ED COURSE  I reviewed the triage vital signs and the nursing notes.                              Assessment and plan Abdominal pain 46 year old male presents to the emergency department with epigastric abdominal pain and multiple episodes of vomiting that started on Thursday.  Vital signs are reassuring at triage.  On exam,  patient was alert and nontoxic-appearing.  Abdomen was soft and mildly tender in the epigastric abdomen without guarding.  Patient had mild elevation of his ALT on CMP but was largely within reference range.  CBC reassuring.  CT abdomen pelvis unremarkable for acute abnormality.  Will obtain urinalysis and administer IV fluids and Zofran and will p.o. challenge.  Will administer IV Pepcid and obtain COVID-19 test.  Patient felt improved after supplemental fluids and Pepcid.  Patient was discharged with a short course of Zofran.  Return precautions were given to return with new or worsening symptoms. Clinical Course as of 02/16/22 2341  Sat Feb 16, 2022  2154 Creatinine: 0.98 [JW]    Clinical Course User Index [JW] Orvil Feil, PA-C     FINAL CLINICAL IMPRESSION(S) / ED DIAGNOSES   Final diagnoses:  Pain of upper abdomen     Rx / DC Orders   ED Discharge Orders          Ordered    ondansetron (ZOFRAN-ODT) 4 MG disintegrating tablet  Every 8 hours PRN        02/16/22 2326             Note:  This document was prepared using Dragon voice recognition software and may include unintentional dictation errors.   Pia Mau Prien, PA-C 02/16/22 2341    Chesley Noon, MD 02/17/22 [phone]

## 2022-02-16 NOTE — ED Triage Notes (Signed)
PT endorsing abdominal pain and vomiting since Thursday. Pt denies urinary problems. Pt endorsing diarrhea as well. Pt denies any other illness at this time. Unable to eat and drink at this time

## 2022-02-16 NOTE — Discharge Instructions (Addendum)
You can take Zofran up to three times a day for nausea and vomiting.  Take Pepcid (40 mg once daily) at night before bed.
